# Patient Record
Sex: Female | Born: 2000
Health system: Southern US, Community
[De-identification: ages and names within clinical notes are randomized; demographics above are authoritative.]

## PROBLEM LIST (undated history)

## (undated) DIAGNOSIS — K900 Celiac disease: Secondary | ICD-10-CM

## (undated) DIAGNOSIS — M419 Scoliosis, unspecified: Secondary | ICD-10-CM

## (undated) HISTORY — DX: Scoliosis, unspecified: M41.9

## (undated) HISTORY — PX: TYMPANOSTOMY: SHX2586

## (undated) HISTORY — DX: Celiac disease: K90.0

## (undated) HISTORY — PX: ADENOIDECTOMY: SUR15

---
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
## 2022-01-02 ENCOUNTER — Telehealth: Payer: Self-pay | Admitting: Cardiology

## 2022-01-02 ENCOUNTER — Other Ambulatory Visit: Payer: Self-pay | Admitting: *Deleted

## 2022-01-02 DIAGNOSIS — R931 Abnormal findings on diagnostic imaging of heart and coronary circulation: Secondary | ICD-10-CM

## 2022-01-02 DIAGNOSIS — R002 Palpitations: Secondary | ICD-10-CM

## 2022-01-02 DIAGNOSIS — R0602 Shortness of breath: Secondary | ICD-10-CM

## 2022-01-02 NOTE — Telephone Encounter (Signed)
BMET order changed to [facility] Order faxed

## 2022-01-02 NOTE — Telephone Encounter (Signed)
New Message:t     Patient's: Mother wants to know if she can have lab work at [facility] and if so will you need to send her an order?

## 2022-01-05 DIAGNOSIS — M4125 Other idiopathic scoliosis, thoracolumbar region: Secondary | ICD-10-CM | POA: Diagnosis not present

## 2022-01-05 DIAGNOSIS — S134XXA Sprain of ligaments of cervical spine, initial encounter: Secondary | ICD-10-CM | POA: Diagnosis not present

## 2022-01-05 DIAGNOSIS — R0602 Shortness of breath: Secondary | ICD-10-CM | POA: Diagnosis not present

## 2022-01-05 DIAGNOSIS — R002 Palpitations: Secondary | ICD-10-CM | POA: Diagnosis not present

## 2022-01-05 DIAGNOSIS — S338XXA Sprain of other parts of lumbar spine and pelvis, initial encounter: Secondary | ICD-10-CM | POA: Diagnosis not present

## 2022-01-05 DIAGNOSIS — R931 Abnormal findings on diagnostic imaging of heart and coronary circulation: Secondary | ICD-10-CM | POA: Diagnosis not present

## 2022-01-05 LAB — BASIC METABOLIC PANEL
BUN: 11 mg/dL (ref 7–20)
CO2: 27 mmol/L (ref 20–32)
Calcium: 9.4 mg/dL (ref 8.9–10.4)
Chloride: 106 mmol/L (ref 98–110)
Creat: 0.77 mg/dL (ref 0.50–0.96)
Glucose, Bld: 102 mg/dL (ref 65–139)
Potassium: 4.2 mmol/L (ref 3.8–5.1)
Sodium: 140 mmol/L (ref 135–146)

## 2022-01-07 ENCOUNTER — Other Ambulatory Visit: Payer: Self-pay

## 2022-01-07 ENCOUNTER — Ambulatory Visit
Admission: RE | Admit: 2022-01-07 | Discharge: 2022-01-07 | Disposition: A | Discharge status: Home / Self Care | Payer: 59 | Source: Ambulatory Visit | Attending: Cardiology | Admitting: Cardiology

## 2022-01-07 ENCOUNTER — Telehealth: Payer: Self-pay | Admitting: Cardiology

## 2022-01-07 DIAGNOSIS — R002 Palpitations: Secondary | ICD-10-CM

## 2022-01-07 DIAGNOSIS — R0602 Shortness of breath: Secondary | ICD-10-CM

## 2022-01-07 DIAGNOSIS — R931 Abnormal findings on diagnostic imaging of heart and coronary circulation: Secondary | ICD-10-CM

## 2022-01-07 NOTE — Telephone Encounter (Signed)
[facility] was not able to get IV in here they tried six time and each time it will blow. She would need to have this down at the hospital for the under ultrasound guided.

## 2022-01-07 NOTE — Telephone Encounter (Signed)
Please read below message.

## 2022-01-08 ENCOUNTER — Other Ambulatory Visit: Payer: Self-pay | Admitting: *Deleted

## 2022-01-08 ENCOUNTER — Telehealth: Payer: Self-pay | Admitting: Cardiology

## 2022-01-08 DIAGNOSIS — R931 Abnormal findings on diagnostic imaging of heart and coronary circulation: Secondary | ICD-10-CM

## 2022-01-08 DIAGNOSIS — R002 Palpitations: Secondary | ICD-10-CM

## 2022-01-08 DIAGNOSIS — R0602 Shortness of breath: Secondary | ICD-10-CM

## 2022-01-08 NOTE — Telephone Encounter (Signed)
PERCERT:  CT ANGIO ABD/PEL W/CM AND/OR W  [facility]   01/21/2022

## 2022-01-12 ENCOUNTER — Telehealth: Payer: Self-pay | Admitting: Cardiology

## 2022-01-12 NOTE — Telephone Encounter (Signed)
PERCERT:  CT ANGIO ABD/PEL W AND OR W/O   01/15/2022  [facility]

## 2022-01-15 ENCOUNTER — Other Ambulatory Visit: Payer: 59

## 2022-01-20 ENCOUNTER — Other Ambulatory Visit (HOSPITAL_COMMUNITY): Payer: 59

## 2022-01-21 ENCOUNTER — Ambulatory Visit: Payer: 59

## 2022-01-26 DIAGNOSIS — Z23 Encounter for immunization: Secondary | ICD-10-CM | POA: Diagnosis not present

## 2022-01-27 ENCOUNTER — Ambulatory Visit
Admission: RE | Admit: 2022-01-27 | Discharge: 2022-01-27 | Disposition: A | Discharge status: Home / Self Care | Payer: 59 | Source: Ambulatory Visit | Attending: Cardiology | Admitting: Cardiology

## 2022-01-27 ENCOUNTER — Telehealth: Payer: Self-pay | Admitting: Cardiology

## 2022-01-27 DIAGNOSIS — R931 Abnormal findings on diagnostic imaging of heart and coronary circulation: Secondary | ICD-10-CM

## 2022-01-27 DIAGNOSIS — I701 Atherosclerosis of renal artery: Secondary | ICD-10-CM | POA: Diagnosis not present

## 2022-01-27 DIAGNOSIS — K551 Chronic vascular disorders of intestine: Secondary | ICD-10-CM | POA: Diagnosis not present

## 2022-01-27 DIAGNOSIS — R002 Palpitations: Secondary | ICD-10-CM

## 2022-01-27 DIAGNOSIS — R0602 Shortness of breath: Secondary | ICD-10-CM

## 2022-01-27 DIAGNOSIS — Q453 Other congenital malformations of pancreas and pancreatic duct: Secondary | ICD-10-CM | POA: Diagnosis not present

## 2022-01-27 DIAGNOSIS — I774 Celiac artery compression syndrome: Secondary | ICD-10-CM | POA: Diagnosis not present

## 2022-01-27 MED ORDER — IOPAMIDOL (ISOVUE-370) INJECTION 76%
75.0000 mL | Freq: Once | INTRAVENOUS | Status: AC | PRN
  Administered 2022-01-27: 75 mL via INTRAVENOUS

## 2022-01-27 NOTE — Telephone Encounter (Signed)
Spoke with Olivia Mackie who says Dr. Adria Devon wanted to call report to Dr. Domenic Polite. Advised Domenic Polite will be back in office tomorrow and it could be called to provider on call. Per Olivia Mackie, Dr. Adria Devon will call office tomorrow when Domenic Polite is in office.  ?

## 2022-01-27 NOTE — Telephone Encounter (Signed)
Call transferred to [facility], RN to discuss abnormal CT results. ?

## 2022-01-28 ENCOUNTER — Telehealth: Payer: Self-pay | Admitting: *Deleted

## 2022-01-28 DIAGNOSIS — I771 Stricture of artery: Secondary | ICD-10-CM

## 2022-01-28 NOTE — Telephone Encounter (Signed)
CTA abdomen and pelvis does show significant proximal celiac artery stenosis with poststenotic dilatation, possibly secondary to compression by ligament and with associated collateral flow, although distal celiac artery and branches are widely patent ?Per Dr. Kara Pacer ahead with Vascular Consult ?Patient's mom, Kendal Hymen informed and verbalized understanding of plan. ?Request Dr. Myra Gianotti ?

## 2022-02-09 ENCOUNTER — Encounter: Payer: Self-pay | Admitting: Cardiology

## 2022-02-12 ENCOUNTER — Encounter (INDEPENDENT_AMBULATORY_CARE_PROVIDER_SITE_OTHER): Payer: Self-pay | Admitting: Gastroenterology

## 2022-02-12 ENCOUNTER — Ambulatory Visit (INDEPENDENT_AMBULATORY_CARE_PROVIDER_SITE_OTHER): Payer: 59 | Admitting: Gastroenterology

## 2022-02-12 DIAGNOSIS — I771 Stricture of artery: Secondary | ICD-10-CM

## 2022-02-12 DIAGNOSIS — R768 Other specified abnormal immunological findings in serum: Secondary | ICD-10-CM | POA: Diagnosis not present

## 2022-02-12 NOTE — Patient Instructions (Addendum)
Follow up with Dr. Myra Gianotti - vascular surgery ?Will request official lab report from PCP ?Perform blood workup ?

## 2022-02-12 NOTE — Progress Notes (Signed)
[name], M.D. ?Gastroenterology & Hepatology ?[facility] ?[address] ?[place], [place] [address] ?Primary Care Physician: ?Joaquin Courts, DO ?[address] ?[place] [address] ? ?Referring MD: Sherren Mocha, PA-C ? ?Chief Complaint: Positive celiac serology and celiac artery stenosis ? ?History of Present Illness: ?Evelyn Haley is a 21 y.o. female with no past medical history, who presents for evaluation of positive celiac serology and celiac artery stenosis. ? ?She denies having any complaint today. States she had blood testing performed at her PCPs office as she had positive celiac serologies.  She was checked for celiac disease as her mother has presumptive diagnosis of celiac disease.  The patient denies having any nausea, vomiting, fever, chills, hematochezia, melena, hematemesis, abdominal distention, abdominal pain, diarrhea, jaundice, pruritus, headaches, mental fogginess or weight loss. ? ?Notably, as part of the evaluation of shortness of breath, the patient was seen in the cardiology clinic.  She was set up for an echocardiogram which was performed on 12/25/2021.  There was turbulent arterial flow of the artery branching of the abdominal aorta suggesting a proximal stenosis.  Due to this, the patient was referred for a CT angio of the abdomen and pelvis.  Underwent this test on 01/27/2022 showing severe stenosis near the origin of the celiac artery suggestive of median arcuate ligament compression with poststenotic dilation.She has an appointment with vascular surgery on Monday (referred by cardiology). ? ?No lab results were available for today's visit.  Joanette Gula, CMA called to the PCPs office and was told the gliadin was elevated at 25. ? ?She currently does not follow any diet. ? ?Last LGX:QJJHERDE ?Last Colonoscopy:negatie ? ?FHx: mother has possible celiac disease, grandfather had celiac disease, neg for any  gastrointestinal/liver disease, father tongue cancer, grandmother breast, uterine cancer and PML, breast cancer aunts x3,uncle pancreatic  cancer, grandfather kidney and lung cancer ?Social: neg smoking, alcohol or illicit drug use ?Surgical: no abdominal surgeries ? ?Past Medical History: ?Past Medical History:  ?Diagnosis Date  ? Celiac disease   ? Scoliosis   ? ? ?Past Surgical History: ?Past Surgical History:  ?Procedure Laterality Date  ? ADENOIDECTOMY    ? TYMPANOSTOMY    ? ? ?Family History: ?Family History  ?Problem Relation Age of Onset  ? Heart disease Mother   ?     Atrial myxoma  ? Heart disease Father   ? Diabetes Father   ? Hypertension Father   ? Colon cancer Father   ? Diabetes Maternal Grandmother   ? Hypertension Maternal Grandmother   ? Lung cancer Maternal Grandmother   ? Heart disease Maternal Grandmother   ? Hypertension Maternal Grandfather   ? Atrial fibrillation Maternal Grandfather   ? Factor V Leiden deficiency Maternal Grandfather   ? Thyroid disease Paternal Grandmother   ? Breast cancer Paternal Grandmother   ? Leukemia Paternal Grandmother   ? Hyperlipidemia Paternal Grandmother   ? Hyperlipidemia Paternal Grandfather   ? Kidney cancer Paternal Grandfather   ? Lung cancer Paternal Grandfather   ? Pulmonary embolism Other   ? ? ?Social History: ?Social History  ? ?Tobacco Use  ?Smoking Status Never  ?Smokeless Tobacco Never  ? ?Social History  ? ?Substance and Sexual Activity  ?Alcohol Use No  ? ?Social History  ? ?Substance and Sexual Activity  ?Drug Use No  ? ? ?Allergies: ?No Known Allergies ? ?Medications: ?Current Outpatient Medications  ?Medication Sig Dispense Refill  ? fexofenadine (ALLEGRA) 180 MG tablet Take  180 mg by mouth as needed.    ? ibuprofen (ADVIL,MOTRIN) 200 MG tablet Take 600 mg by mouth daily as needed for moderate pain.    ? ?No current facility-administered medications for this visit.  ? ? ?Review of Systems: ?GENERAL: negative for malaise, night sweats ?HEENT:  No changes in hearing or vision, no nose bleeds or other nasal problems. ?NECK: Negative for lumps, goiter, pain and significant neck swelling ?RESPIRATORY: Negative for cough, wheezing ?CARDIOVASCULAR: Negative for chest pain, leg swelling, palpitations, orthopnea ?GI: SEE HPI ?MUSCULOSKELETAL: Negative for joint pain or swelling, back pain, and muscle pain. ?SKIN: Negative for lesions, rash ?PSYCH: Negative for sleep disturbance, mood disorder and recent psychosocial stressors. ?HEMATOLOGY Negative for prolonged bleeding, bruising easily, and swollen nodes. ?ENDOCRINE: Negative for cold or heat intolerance, polyuria, polydipsia and goiter. ?NEURO: negative for tremor, gait imbalance, syncope and seizures. ?The remainder of the review of systems is noncontributory. ? ? ?Physical Exam: ?BP 106/72 (BP Location: Right Arm, Patient Position: Sitting, Cuff Size: Small)   Pulse 75   Temp 97.8 ?F (36.6 ?C) (Oral)   Ht 5\' 5"  (1.651 m)   Wt 127 lb 6.4 oz (57.8 kg)   LMP 02/03/2022 (Approximate)   BMI 21.20 kg/m?  ?GENERAL: The patient is AO x3, in no acute distress. ?HEENT: Head is normocephalic and atraumatic. EOMI are intact. Mouth is well hydrated and without lesions. ?NECK: Supple. No masses ?LUNGS: Clear to auscultation. No presence of rhonchi/wheezing/rales. Adequate chest expansion ?HEART: RRR, normal s1 and s2. ?ABDOMEN: Soft, nontender, no guarding, no peritoneal signs, and nondistended. BS +. No masses. ?EXTREMITIES: Without any cyanosis, clubbing, rash, lesions or edema. ?NEUROLOGIC: AOx3, no focal motor deficit. ?SKIN: no jaundice, no rashes ? ? ?Imaging/Labs: ?as above ? ?I personally reviewed and interpreted the available labs, imaging and endoscopic files. ? ?Impression and Plan: ?Evelyn Haley is a 21 y.o. female with no past medical history, who presents for evaluation of positive celiac serology and celiac artery stenosis.  Patient has been asymptomatic from the gastrointestinal perspective and  denies having any abdominal complaints.  It is unclear if she had positive serology for celiac disease but given her family history we will recheck it today as we were not able to obtain the records from her PCPs office.  I explained to the mother and the patient that if the serologies come back elevated, we will need to proceed with an EGD to confirm this diagnosis.  Both agreed understood. ? ?In terms of her celiac artery stenosis, she has evidence on imaging of this abnormality but has not presented any abdominal pain.  I explained that this is important as the patient does not have abdominal pain with this anatomic abnormality, clinical monitoring may be adequate.  In case she presents any new onset of abdominal pain, nausea or vomiting, further surgical management of this alteration can be considered.  She will follow with vascular surgery next week as well. ? ?- Follow up with Dr. 15 - vascular surgery ?- Will request official lab report from PCP ?- Check celiac serologies ? ?All questions were answered.     ? ?Myra Gianotti, MD ?Gastroenterology and Hepatology ?[facility] ? ? ?ADDENDUM: ?While patient was noted, I received the results of her most recent blood work-up on 10/15/2021 that showed clear lung IgG of 25, negative antiendomysial antibody and negative gliadin  IgA.  We will still need TTG IgA levels ?

## 2022-02-14 LAB — CELIAC DISEASE PANEL
(tTG) Ab, IgA: 9.3 U/mL
(tTG) Ab, IgG: <1 U/mL
Gliadin IgA: 2.1 U/mL
Gliadin IgG: 12.3 U/mL
Immunoglobulin A: 75 mg/dL (ref 47–310)

## 2022-02-16 ENCOUNTER — Ambulatory Visit: Payer: 59 | Admitting: Surgery

## 2022-02-16 ENCOUNTER — Encounter: Payer: Self-pay | Admitting: Surgery

## 2022-02-16 VITALS — BP 103/70 | HR 67 | Temp 98.1°F | Resp 20 | Ht 65.0 in | Wt 126.0 lb

## 2022-02-16 DIAGNOSIS — I774 Celiac artery compression syndrome: Secondary | ICD-10-CM | POA: Diagnosis not present

## 2022-02-16 DIAGNOSIS — S134XXA Sprain of ligaments of cervical spine, initial encounter: Secondary | ICD-10-CM | POA: Diagnosis not present

## 2022-02-16 DIAGNOSIS — S338XXA Sprain of other parts of lumbar spine and pelvis, initial encounter: Secondary | ICD-10-CM | POA: Diagnosis not present

## 2022-02-16 DIAGNOSIS — M4125 Other idiopathic scoliosis, thoracolumbar region: Secondary | ICD-10-CM | POA: Diagnosis not present

## 2022-02-16 NOTE — Progress Notes (Signed)
? ?[facility] ? ?Patient name: Evelyn Haley MRN: [id number] DOB: 11-10-2001 Sex: female ? ? ?REQUESTING PROVIDER:  ? ? Dr. Diona Browner ? ? ?REASON FOR CONSULT:  ?  ?Celiac stenosis ? ?HISTORY OF PRESENT ILLNESS:  ? ?Evelyn Haley is a 21 y.o. female, who is referred for evaluation of celiac artery stenosis.  In November of last year, she experienced some chest pain and shortness of breath.  She was referred to cardiology.  A echocardiogram was performed.  This revealed turbulent flow in the abdomen, and so she was sent for a CT scan which showed severe celiac artery stenosis with poststenotic dilatation, and findings concerning for median arcuate ligament syndrome.  The patient denies any abdominal pain.  She does not have postprandial abdominal pain.  She denies any weight loss. ? ?There is a possible family history of celiac disease.  The patient is being worked up for this.  She has positive celiac serologies.  She is going to get a EGD to confirm her diagnosis. ? ?PAST MEDICAL HISTORY  ? ? ?Past Medical History:  ?Diagnosis Date  ? Celiac disease   ? Scoliosis   ? ? ? ?FAMILY HISTORY  ? ?Family History  ?Problem Relation Age of Onset  ? Heart disease Mother   ?     Atrial myxoma  ? Heart disease Father   ? Diabetes Father   ? Hypertension Father   ? Colon cancer Father   ? Diabetes Maternal Grandmother   ? Hypertension Maternal Grandmother   ? Lung cancer Maternal Grandmother   ? Heart disease Maternal Grandmother   ? Hypertension Maternal Grandfather   ? Atrial fibrillation Maternal Grandfather   ? Factor V Leiden deficiency Maternal Grandfather   ? Thyroid disease Paternal Grandmother   ? Breast cancer Paternal Grandmother   ? Leukemia Paternal Grandmother   ? Hyperlipidemia Paternal Grandmother   ? Hyperlipidemia Paternal Grandfather   ? Kidney cancer Paternal Grandfather   ? Lung cancer Paternal Grandfather   ? Pulmonary embolism Other    ? ? ?SOCIAL HISTORY:  ? ?Social History  ? ?Socioeconomic History  ? Marital status: Single  ?  Spouse name: Not on file  ? Number of children: Not on file  ? Years of education: Not on file  ? Highest education level: Not on file  ?Occupational History  ? Not on file  ?Tobacco Use  ? Smoking status: Never  ? Smokeless tobacco: Never  ?Vaping Use  ? Vaping Use: Never used  ?Substance and Sexual Activity  ? Alcohol use: No  ? Drug use: No  ? Sexual activity: Not on file  ?Other Topics Concern  ? Not on file  ?Social History Narrative  ? Not on file  ? ?Social Determinants of Health  ? ?Financial Resource Strain: Not on file  ?Food Insecurity: Not on file  ?Transportation Needs: Not on file  ?Physical Activity: Not on file  ?Stress: Not on file  ?Social Connections: Not on file  ?Intimate Partner Violence: Not on file  ? ? ?ALLERGIES:  ? ? ?No Known Allergies ? ?CURRENT MEDICATIONS:  ? ? ?Current Outpatient Medications  ?Medication Sig Dispense Refill  ? fexofenadine (ALLEGRA) 180 MG tablet Take 180 mg by mouth as needed.    ? ibuprofen (ADVIL,MOTRIN) 200 MG tablet Take 600 mg by mouth daily as needed for moderate pain.    ? ?No current facility-administered medications for this visit.  ? ? ?REVIEW OF SYSTEMS:  ? ?[  X] denotes positive finding, [ ]  denotes negative finding ?Cardiac  Comments:  ?Chest pain or chest pressure:    ?Shortness of breath upon exertion:    ?Short of breath when lying flat:    ?Irregular heart rhythm:    ?    ?Vascular    ?Pain in calf, thigh, or hip brought on by ambulation:    ?Pain in feet at night that wakes you up from your sleep:     ?Blood clot in your veins:    ?Leg swelling:     ?    ?Pulmonary    ?Oxygen at home:    ?Productive cough:     ?Wheezing:     ?    ?Neurologic    ?Sudden weakness in arms or legs:     ?Sudden numbness in arms or legs:     ?Sudden onset of difficulty speaking or slurred speech:    ?Temporary loss of vision in one eye:     ?Problems with dizziness:     ?     ?Gastrointestinal    ?Blood in stool:     ? ?Vomited blood:     ?    ?Genitourinary    ?Burning when urinating:     ?Blood in urine:    ?    ?Psychiatric    ?Major depression:     ?    ?Hematologic    ?Bleeding problems:    ?Problems with blood clotting too easily:    ?    ?Skin    ?Rashes or ulcers:    ?    ?Constitutional    ?Fever or chills:    ? ?PHYSICAL EXAM:  ? ?There were no vitals filed for this visit. ? ?GENERAL: The patient is a well-nourished female, in no acute distress. The vital signs are documented above. ?CARDIAC: There is a regular rate and rhythm.  ?VASCULAR: Palpable pedal pulses ?PULMONARY: Nonlabored respirations ?ABDOMEN: Soft and non-tender.  No discomfort with deep palpation ?MUSCULOSKELETAL: There are no major deformities or cyanosis. ?NEUROLOGIC: No focal weakness or paresthesias are detected. ?SKIN: There are no ulcers or rashes noted. ?PSYCHIATRIC: The patient has a normal affect. ? ?STUDIES:  ? ?I have reviewed the following CTA: ?The celiac artery demonstrates severe stenosis near its origin, an ?appearance compatible with median arcuate ligament compression. ?There is poststenotic dilation, which likely explains the findings ?of turbulent flow and elevated velocities on recent arterial duplex ?exam. The distal celiac artery and its branches are widely patent. ?In the absence of abdominal symptoms, this finding is of uncertain ?clinical significance as it can be seen in asymptomatic patients. ?  ? ?ASSESSMENT and PLAN  ? ?Celiac artery stenosis/median arcuate ligament syndrome: I discussed with the patient and her mother, that because she is completely asymptomatic, I would not recommend surgical decompression at this time.  I instructed her to monitor for abdominal pain, postprandial abdominal pain, and weight loss.  These factors would potentially lead me to consider celiac artery decompression.  As long as she remains asymptomatic, I would not recommend treatment.  She will contact  me if her condition changes.  Otherwise, I will see her back as needed ? ? ? , IV, MD, FACS ?[facility] ?Tel [phone] ?Pager [phone]  ?

## 2022-03-04 DIAGNOSIS — S338XXA Sprain of other parts of lumbar spine and pelvis, initial encounter: Secondary | ICD-10-CM | POA: Diagnosis not present

## 2022-03-04 DIAGNOSIS — M4125 Other idiopathic scoliosis, thoracolumbar region: Secondary | ICD-10-CM | POA: Diagnosis not present

## 2022-03-04 DIAGNOSIS — S134XXA Sprain of ligaments of cervical spine, initial encounter: Secondary | ICD-10-CM | POA: Diagnosis not present

## 2022-04-03 DIAGNOSIS — H6123 Impacted cerumen, bilateral: Secondary | ICD-10-CM | POA: Diagnosis not present

## 2022-04-03 DIAGNOSIS — H608X3 Other otitis externa, bilateral: Secondary | ICD-10-CM | POA: Diagnosis not present

## 2022-05-05 DIAGNOSIS — S134XXA Sprain of ligaments of cervical spine, initial encounter: Secondary | ICD-10-CM | POA: Diagnosis not present

## 2022-05-05 DIAGNOSIS — M4125 Other idiopathic scoliosis, thoracolumbar region: Secondary | ICD-10-CM | POA: Diagnosis not present

## 2022-05-05 DIAGNOSIS — S338XXA Sprain of other parts of lumbar spine and pelvis, initial encounter: Secondary | ICD-10-CM | POA: Diagnosis not present

## 2022-05-26 DIAGNOSIS — M4125 Other idiopathic scoliosis, thoracolumbar region: Secondary | ICD-10-CM | POA: Diagnosis not present

## 2022-05-26 DIAGNOSIS — S338XXA Sprain of other parts of lumbar spine and pelvis, initial encounter: Secondary | ICD-10-CM | POA: Diagnosis not present

## 2022-05-26 DIAGNOSIS — S134XXA Sprain of ligaments of cervical spine, initial encounter: Secondary | ICD-10-CM | POA: Diagnosis not present

## 2022-05-27 ENCOUNTER — Encounter: Payer: Self-pay | Admitting: Physician Assistant

## 2022-05-27 ENCOUNTER — Ambulatory Visit (INDEPENDENT_AMBULATORY_CARE_PROVIDER_SITE_OTHER): Payer: 59 | Admitting: Physician Assistant

## 2022-05-27 DIAGNOSIS — Z1283 Encounter for screening for malignant neoplasm of skin: Secondary | ICD-10-CM | POA: Diagnosis not present

## 2022-05-27 DIAGNOSIS — Z840 Family history of diseases of the skin and subcutaneous tissue: Secondary | ICD-10-CM

## 2022-05-27 DIAGNOSIS — L409 Psoriasis, unspecified: Secondary | ICD-10-CM | POA: Diagnosis not present

## 2022-05-27 MED ORDER — TRIAMCINOLONE ACETONIDE 0.1 % EX CREA: TOPICAL_CREAM | CUTANEOUS | 2 refills | Status: AC

## 2022-05-27 MED ORDER — CLOBETASOL PROP EMOLLIENT BASE 0.05 % EX CREA: TOPICAL_CREAM | CUTANEOUS | 8 refills | Status: AC

## 2022-05-27 NOTE — Progress Notes (Signed)
   New Patient   Subjective  Evelyn Haley is a 21 y.o. female who presents for the following: New Patient (Initial Visit) (Patient here today for skin check, per patient she was just diagnosed x 1 year ago with eczema and she believes it's now under both axilla and posterior neck. Per patient she was given TAC for the eczema in her ears and she doesn't have much to use for her axilla or neck. Family history of atypical mole. ).   The following portions of the chart were reviewed this encounter and updated as appropriate:  Tobacco  Allergies  Meds  Problems  Med Hx  Surg Hx  Fam Hx      Objective  Well appearing patient in no apparent distress; mood and affect are within normal limits.  A full examination was performed including scalp, head, eyes, ears, nose, lips, neck, chest, axillae, abdomen, back, buttocks, bilateral upper extremities, bilateral lower extremities, hands, feet, fingers, toes, fingernails, and toenails. All findings within normal limits unless otherwise noted below.  No atypical nevi or signs of NMSC noted at the time of the visit.   Neck - Posterior Well-marginated erythematous plaques.         Assessment & Plan  Encounter for screening for malignant neoplasm of skin  Yearly skin examination  Psoriasis Neck - Posterior  triamcinolone cream (KENALOG) 0.1 % - Neck - Posterior Apply to affected area wd- not for face, folds or groin  Clobetasol Prop Emollient Base (CLOBETASOL PROPIONATE E) 0.05 % emollient cream - Neck - Posterior Aaa qd- not for face, folds or groin     I, Caleah Tortorelli, PA-C, have reviewed all documentation's for this visit.  The documentation on 05/27/22 for the exam, diagnosis, procedures and orders are all accurate and complete.

## 2022-06-02 ENCOUNTER — Encounter: Payer: Self-pay | Admitting: Physician Assistant

## 2022-06-23 DIAGNOSIS — S134XXA Sprain of ligaments of cervical spine, initial encounter: Secondary | ICD-10-CM | POA: Diagnosis not present

## 2022-06-23 DIAGNOSIS — M4125 Other idiopathic scoliosis, thoracolumbar region: Secondary | ICD-10-CM | POA: Diagnosis not present

## 2022-06-23 DIAGNOSIS — S338XXA Sprain of other parts of lumbar spine and pelvis, initial encounter: Secondary | ICD-10-CM | POA: Diagnosis not present

## 2022-07-16 IMAGING — CT CT CTA ABD/PEL W/CM AND/OR W/O CM
1 of 2 series · 11 of 32 positions shown, 17 images · IV contrast (agent unspecified)
Comparison: CT chest October 2021

CLINICAL DATA: abdominal CTA with attention to aorta and mesenteric
vasculature, abnormal echo with elevated turbulent flow/elevated
velocity of an aortic branch vessel

EXAM:
CTA ABDOMEN AND PELVIS WITHOUT AND WITH CONTRAST
TECHNIQUE: Multidetector CT imaging of the abdomen and pelvis was performed
using the standard protocol during bolus administration of
intravenous contrast. Multiplanar reconstructed images and MIPs were
obtained and reviewed to evaluate the vascular anatomy.

[Series 10: venous 5.0 · axial · portal-venous · 0.71mm/px · z∈[-497,-72]mm · 11 of 103 slices shown, 17 images]
[im 9/103  soft-tissue]
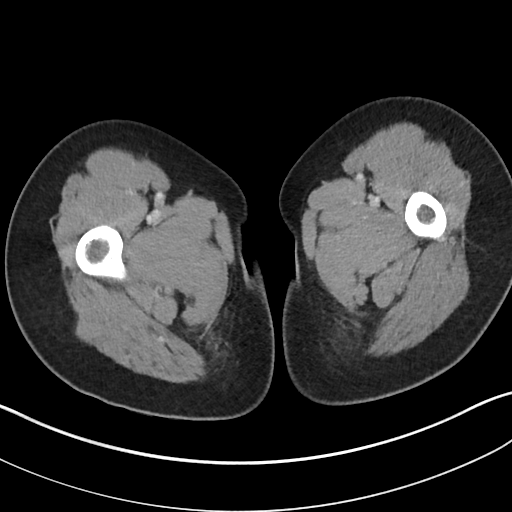
[im 9/103  bone]
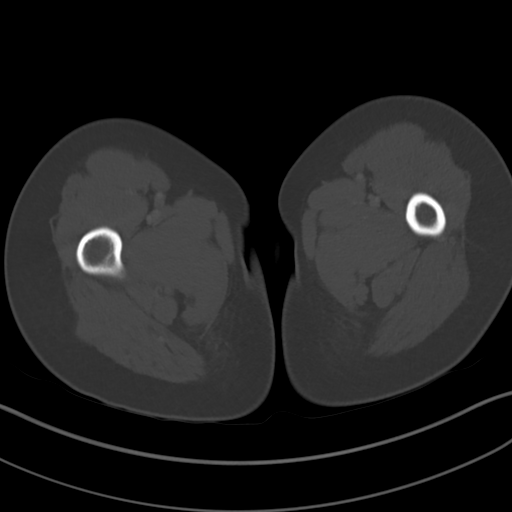
[im 18/103  soft-tissue]
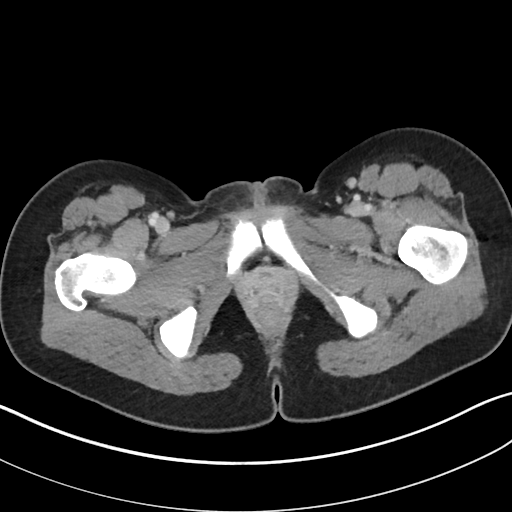
[im 26/103  soft-tissue]
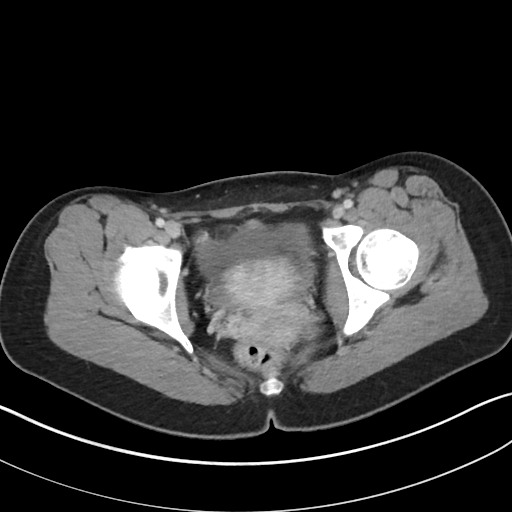
[im 35/103  soft-tissue]
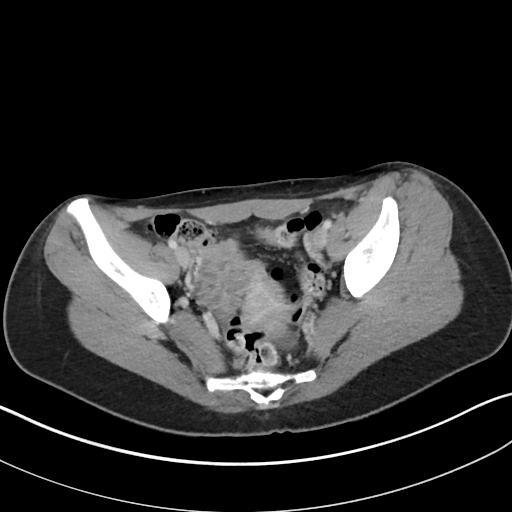
[im 43/103  soft-tissue]
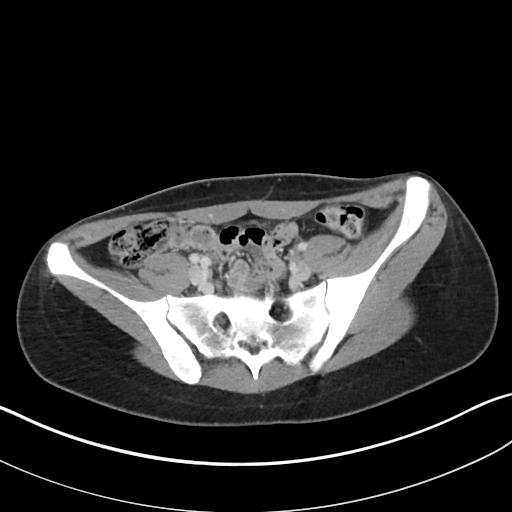
[im 52/103  soft-tissue]
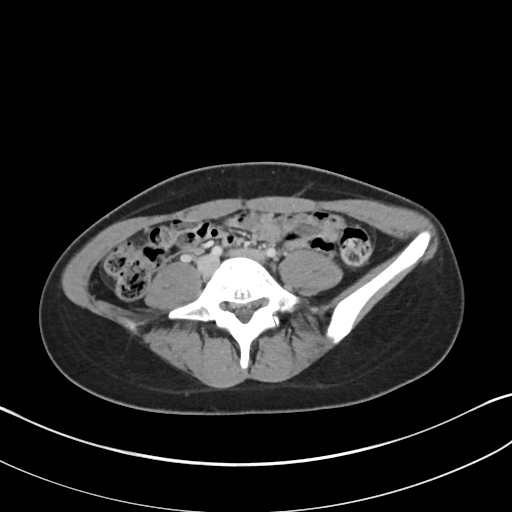
[im 60/103  soft-tissue]
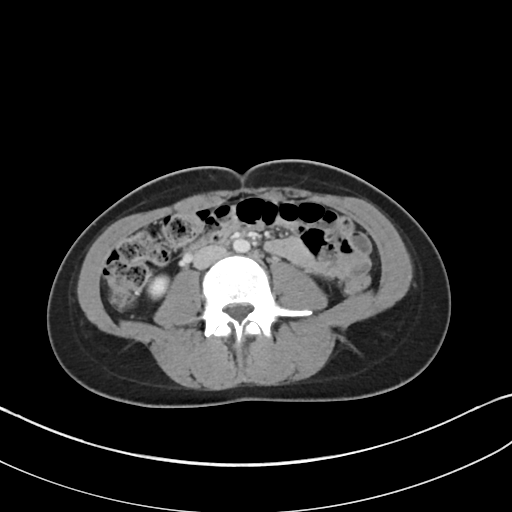
[im 69/103  soft-tissue]
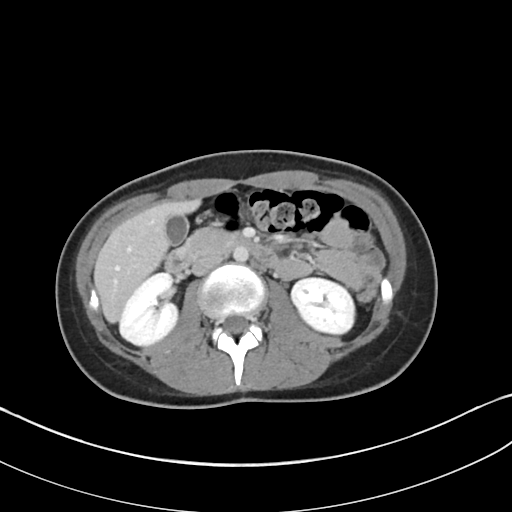
[im 69/103  lung]
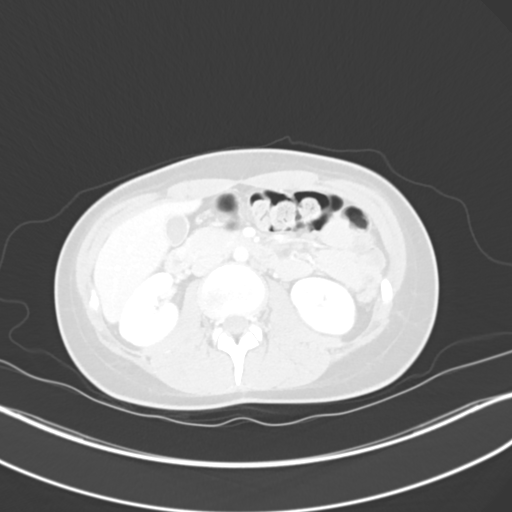
[im 77/103  soft-tissue]
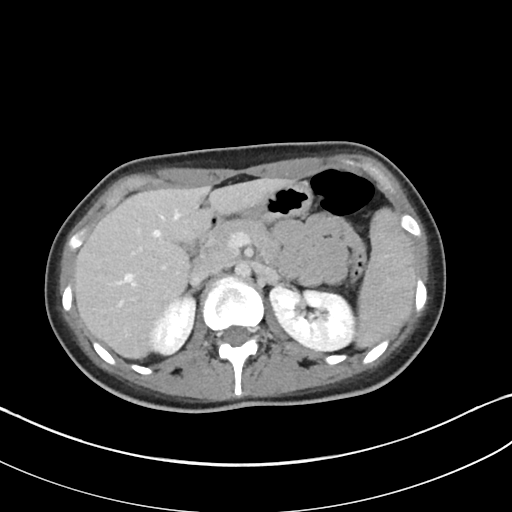
[im 77/103  lung]
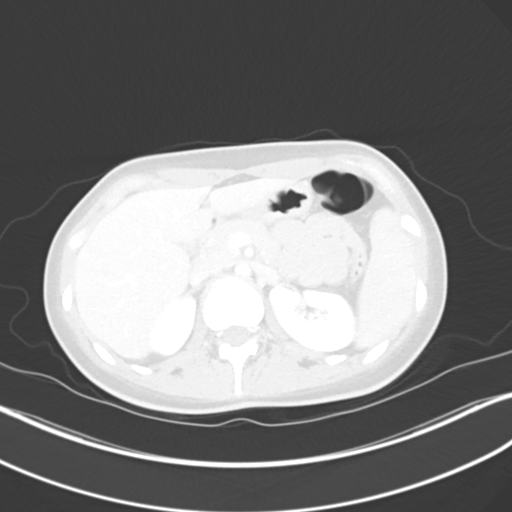
[im 77/103  bone]
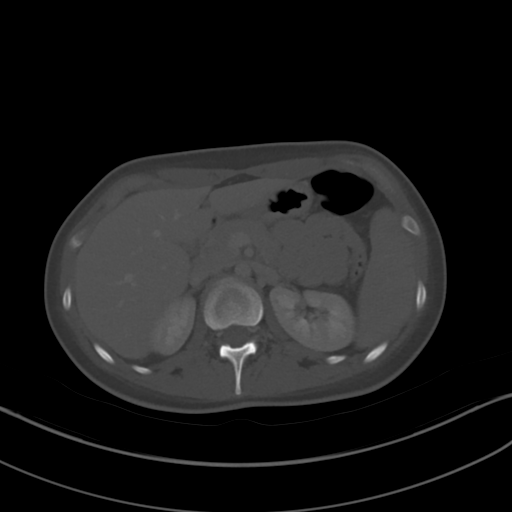
[im 86/103  soft-tissue]
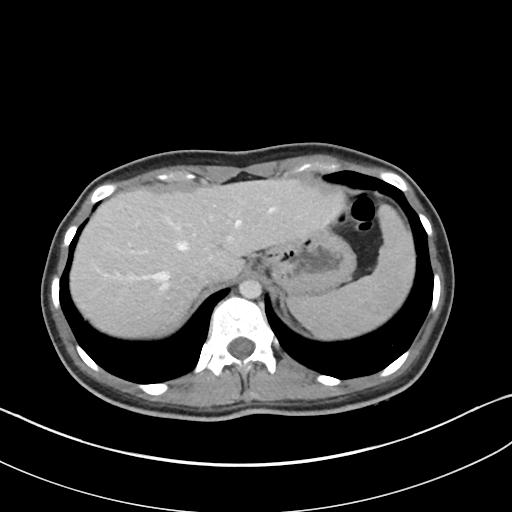
[im 86/103  lung]
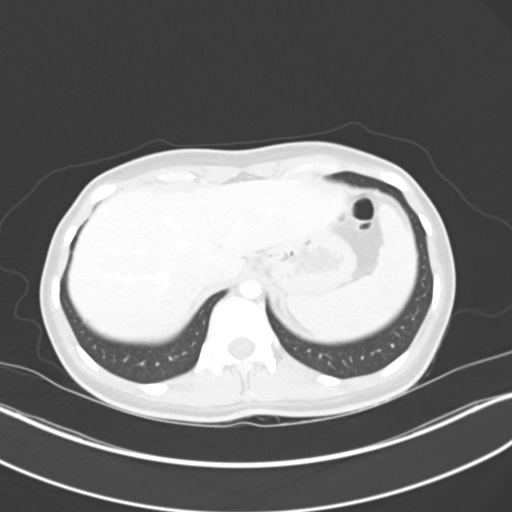
[im 94/103  soft-tissue]
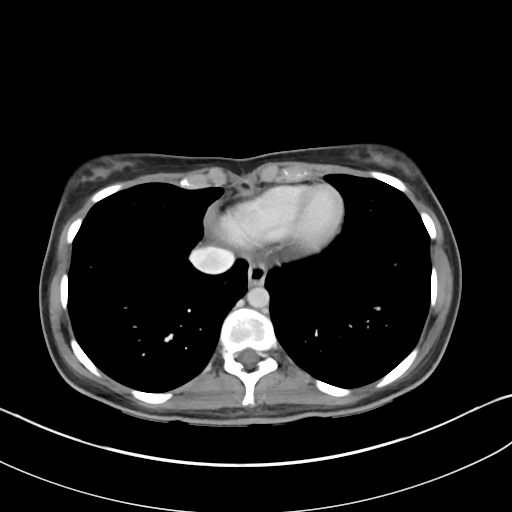
[im 94/103  lung]
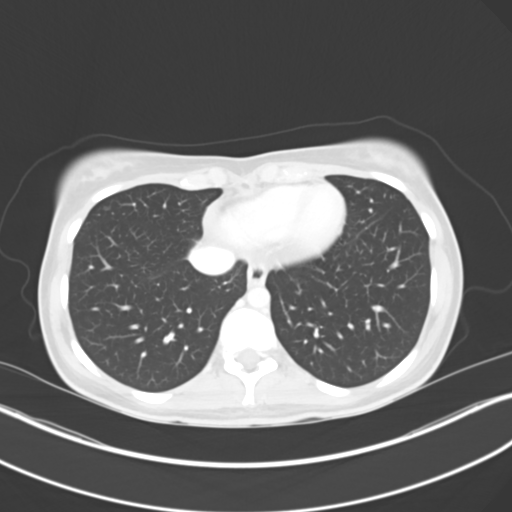

[11 of 32 positions shown; findings below may reference images not displayed]

RADIATION DOSE REDUCTION: This exam was performed according to the
departmental dose-optimization program which includes automated
exposure control, adjustment of the mA and/or kV according to
patient size and/or use of iterative reconstruction technique.

CONTRAST:  75mL [id number]-91T IOPAMIDOL ([id number]-91T) INJECTION 76%
FINDINGS: VASCULAR

Aorta: Normal caliber aorta without aneurysm, dissection, vasculitis
or significant stenosis.

Celiac: There is severe stenosis of the proximal celiac artery with
poststenotic dilation. The mid to distal celiac artery and its
branches are otherwise widely patent.

SMA: Patent without evidence of aneurysm, dissection, vasculitis or
significant stenosis. Note is made of a well-developed
pancreaticoduodenal arcade, likely due to collateral blood supply
from the SMA to the celiac distribution.

IMA: Patent.

Renals: Renal are patent without evidence of aneurysm, dissection,
vasculitis, fibromuscular dysplasia or significant stenosis. Single
renal arteries bilaterally.

Inflow: Patent without evidence of aneurysm, dissection, vasculitis
or significant stenosis.

Veins: The portal vein is patent. The deep veins and IVC are patent
without thrombus.

NON-VASCULAR

Inferior chest: The lung bases are well-aerated.

Hepatobiliary: The liver is normal in size without focal
abnormality. No intrahepatic or extrahepatic biliary ductal
dilation. The gallbladder appears normal.

Spleen: Normal in size without focal abnormality.

Pancreas: No pancreatic ductal dilatation or surrounding
inflammatory changes.

Adrenals/Urinary Tract: Adrenal glands are unremarkable. Kidneys are
normal, without renal calculi, focal lesion, or hydronephrosis.
Bladder is unremarkable.

Stomach/Bowel: The stomach, small bowel and large bowel are normal
in caliber without abnormal wall thickening or surrounding
inflammatory changes.

Reproductive: Normal physiologic appearance of the uterus and
ovaries.

Lymphatic: No enlarged lymph nodes in the abdomen or pelvis.

Other: No abdominopelvic ascites.

Musculoskeletal: No aggressive osseous lesions. The soft tissues are
unremarkable.
IMPRESSION: The celiac artery demonstrates severe stenosis near its origin, an
appearance compatible with median arcuate ligament compression.
There is poststenotic dilation, which likely explains the findings
of turbulent flow and elevated velocities on recent arterial duplex
exam. The distal celiac artery and its branches are widely patent.
In the absence of abdominal symptoms, this finding is of uncertain
clinical significance as it can be seen in asymptomatic patients.

## 2022-07-28 DIAGNOSIS — S338XXA Sprain of other parts of lumbar spine and pelvis, initial encounter: Secondary | ICD-10-CM | POA: Diagnosis not present

## 2022-07-28 DIAGNOSIS — S134XXA Sprain of ligaments of cervical spine, initial encounter: Secondary | ICD-10-CM | POA: Diagnosis not present

## 2022-07-28 DIAGNOSIS — M4125 Other idiopathic scoliosis, thoracolumbar region: Secondary | ICD-10-CM | POA: Diagnosis not present

## 2022-08-05 DIAGNOSIS — H6123 Impacted cerumen, bilateral: Secondary | ICD-10-CM | POA: Diagnosis not present

## 2022-08-25 DIAGNOSIS — M4125 Other idiopathic scoliosis, thoracolumbar region: Secondary | ICD-10-CM | POA: Diagnosis not present

## 2022-08-25 DIAGNOSIS — S338XXA Sprain of other parts of lumbar spine and pelvis, initial encounter: Secondary | ICD-10-CM | POA: Diagnosis not present

## 2022-08-25 DIAGNOSIS — S134XXA Sprain of ligaments of cervical spine, initial encounter: Secondary | ICD-10-CM | POA: Diagnosis not present

## 2022-11-10 DIAGNOSIS — H608X3 Other otitis externa, bilateral: Secondary | ICD-10-CM | POA: Diagnosis not present

## 2022-11-10 DIAGNOSIS — H6123 Impacted cerumen, bilateral: Secondary | ICD-10-CM | POA: Diagnosis not present

## 2023-01-04 DIAGNOSIS — L600 Ingrowing nail: Secondary | ICD-10-CM | POA: Diagnosis not present

## 2023-01-04 DIAGNOSIS — M79674 Pain in right toe(s): Secondary | ICD-10-CM | POA: Diagnosis not present

## 2023-01-05 DIAGNOSIS — H04123 Dry eye syndrome of bilateral lacrimal glands: Secondary | ICD-10-CM | POA: Diagnosis not present

## 2023-01-15 DIAGNOSIS — Z6821 Body mass index (BMI) 21.0-21.9, adult: Secondary | ICD-10-CM | POA: Diagnosis not present

## 2023-01-15 DIAGNOSIS — Z01419 Encounter for gynecological examination (general) (routine) without abnormal findings: Secondary | ICD-10-CM | POA: Diagnosis not present

## 2023-05-12 DIAGNOSIS — H5213 Myopia, bilateral: Secondary | ICD-10-CM | POA: Diagnosis not present

## 2023-06-03 DIAGNOSIS — R300 Dysuria: Secondary | ICD-10-CM | POA: Diagnosis not present

## 2023-06-14 DIAGNOSIS — H6123 Impacted cerumen, bilateral: Secondary | ICD-10-CM | POA: Diagnosis not present

## 2023-06-14 DIAGNOSIS — H60543 Acute eczematoid otitis externa, bilateral: Secondary | ICD-10-CM | POA: Diagnosis not present

## 2023-06-22 DIAGNOSIS — M79675 Pain in left toe(s): Secondary | ICD-10-CM | POA: Diagnosis not present

## 2023-06-22 DIAGNOSIS — L600 Ingrowing nail: Secondary | ICD-10-CM | POA: Diagnosis not present

## 2023-06-25 DIAGNOSIS — S134XXA Sprain of ligaments of cervical spine, initial encounter: Secondary | ICD-10-CM | POA: Diagnosis not present

## 2023-06-25 DIAGNOSIS — M4125 Other idiopathic scoliosis, thoracolumbar region: Secondary | ICD-10-CM | POA: Diagnosis not present

## 2023-06-25 DIAGNOSIS — S338XXA Sprain of other parts of lumbar spine and pelvis, initial encounter: Secondary | ICD-10-CM | POA: Diagnosis not present

## 2023-06-30 ENCOUNTER — Other Ambulatory Visit (HOSPITAL_COMMUNITY): Payer: Self-pay

## 2023-06-30 DIAGNOSIS — Z0000 Encounter for general adult medical examination without abnormal findings: Secondary | ICD-10-CM | POA: Diagnosis not present

## 2023-06-30 DIAGNOSIS — Z23 Encounter for immunization: Secondary | ICD-10-CM | POA: Diagnosis not present

## 2023-07-01 ENCOUNTER — Other Ambulatory Visit (HOSPITAL_COMMUNITY): Payer: Self-pay

## 2023-07-03 ENCOUNTER — Other Ambulatory Visit (HOSPITAL_COMMUNITY): Payer: Self-pay

## 2023-07-03 MED ORDER — EPINEPHRINE 0.3 MG/0.3ML IJ SOAJ
0.3000 mg | INTRAMUSCULAR | 0 refills | Status: AC
  Filled 2023-07-03: qty 2, 30d supply, fill #0

## 2023-07-05 ENCOUNTER — Other Ambulatory Visit (HOSPITAL_COMMUNITY): Payer: Self-pay

## 2023-07-06 ENCOUNTER — Other Ambulatory Visit (HOSPITAL_COMMUNITY): Payer: Self-pay

## 2023-07-23 DIAGNOSIS — S338XXA Sprain of other parts of lumbar spine and pelvis, initial encounter: Secondary | ICD-10-CM | POA: Diagnosis not present

## 2023-07-23 DIAGNOSIS — M4125 Other idiopathic scoliosis, thoracolumbar region: Secondary | ICD-10-CM | POA: Diagnosis not present

## 2023-07-23 DIAGNOSIS — S134XXA Sprain of ligaments of cervical spine, initial encounter: Secondary | ICD-10-CM | POA: Diagnosis not present

## 2023-08-20 DIAGNOSIS — M4125 Other idiopathic scoliosis, thoracolumbar region: Secondary | ICD-10-CM | POA: Diagnosis not present

## 2023-08-20 DIAGNOSIS — S134XXA Sprain of ligaments of cervical spine, initial encounter: Secondary | ICD-10-CM | POA: Diagnosis not present

## 2023-08-20 DIAGNOSIS — S338XXA Sprain of other parts of lumbar spine and pelvis, initial encounter: Secondary | ICD-10-CM | POA: Diagnosis not present

## 2023-09-16 ENCOUNTER — Other Ambulatory Visit (HOSPITAL_COMMUNITY): Payer: Self-pay | Admitting: Radiology

## 2023-09-16 DIAGNOSIS — R0600 Dyspnea, unspecified: Secondary | ICD-10-CM

## 2023-09-17 DIAGNOSIS — S338XXA Sprain of other parts of lumbar spine and pelvis, initial encounter: Secondary | ICD-10-CM | POA: Diagnosis not present

## 2023-09-17 DIAGNOSIS — S134XXA Sprain of ligaments of cervical spine, initial encounter: Secondary | ICD-10-CM | POA: Diagnosis not present

## 2023-09-17 DIAGNOSIS — M4125 Other idiopathic scoliosis, thoracolumbar region: Secondary | ICD-10-CM | POA: Diagnosis not present

## 2023-10-22 DIAGNOSIS — S338XXA Sprain of other parts of lumbar spine and pelvis, initial encounter: Secondary | ICD-10-CM | POA: Diagnosis not present

## 2023-10-22 DIAGNOSIS — S134XXA Sprain of ligaments of cervical spine, initial encounter: Secondary | ICD-10-CM | POA: Diagnosis not present

## 2023-10-22 DIAGNOSIS — M4125 Other idiopathic scoliosis, thoracolumbar region: Secondary | ICD-10-CM | POA: Diagnosis not present

## 2023-11-04 ENCOUNTER — Ambulatory Visit (HOSPITAL_COMMUNITY): Payer: Commercial Managed Care - PPO

## 2023-11-19 DIAGNOSIS — M4125 Other idiopathic scoliosis, thoracolumbar region: Secondary | ICD-10-CM | POA: Diagnosis not present

## 2023-11-19 DIAGNOSIS — S134XXA Sprain of ligaments of cervical spine, initial encounter: Secondary | ICD-10-CM | POA: Diagnosis not present

## 2023-11-19 DIAGNOSIS — S338XXA Sprain of other parts of lumbar spine and pelvis, initial encounter: Secondary | ICD-10-CM | POA: Diagnosis not present

## 2023-12-17 DIAGNOSIS — H60543 Acute eczematoid otitis externa, bilateral: Secondary | ICD-10-CM | POA: Diagnosis not present

## 2023-12-17 DIAGNOSIS — H6123 Impacted cerumen, bilateral: Secondary | ICD-10-CM | POA: Diagnosis not present

## 2024-03-17 DIAGNOSIS — H6123 Impacted cerumen, bilateral: Secondary | ICD-10-CM | POA: Diagnosis not present

## 2024-04-03 DIAGNOSIS — S338XXA Sprain of other parts of lumbar spine and pelvis, initial encounter: Secondary | ICD-10-CM | POA: Diagnosis not present

## 2024-04-03 DIAGNOSIS — M4125 Other idiopathic scoliosis, thoracolumbar region: Secondary | ICD-10-CM | POA: Diagnosis not present

## 2024-04-03 DIAGNOSIS — S134XXA Sprain of ligaments of cervical spine, initial encounter: Secondary | ICD-10-CM | POA: Diagnosis not present

## 2024-05-01 DIAGNOSIS — M4125 Other idiopathic scoliosis, thoracolumbar region: Secondary | ICD-10-CM | POA: Diagnosis not present

## 2024-05-01 DIAGNOSIS — S134XXA Sprain of ligaments of cervical spine, initial encounter: Secondary | ICD-10-CM | POA: Diagnosis not present

## 2024-05-01 DIAGNOSIS — S338XXA Sprain of other parts of lumbar spine and pelvis, initial encounter: Secondary | ICD-10-CM | POA: Diagnosis not present

## 2024-05-17 DIAGNOSIS — B9689 Other specified bacterial agents as the cause of diseases classified elsewhere: Secondary | ICD-10-CM | POA: Diagnosis not present

## 2024-05-17 DIAGNOSIS — Z1283 Encounter for screening for malignant neoplasm of skin: Secondary | ICD-10-CM | POA: Diagnosis not present

## 2024-05-17 DIAGNOSIS — D225 Melanocytic nevi of trunk: Secondary | ICD-10-CM | POA: Diagnosis not present

## 2024-05-17 DIAGNOSIS — B355 Tinea imbricata: Secondary | ICD-10-CM | POA: Diagnosis not present

## 2024-05-17 DIAGNOSIS — L02421 Furuncle of right axilla: Secondary | ICD-10-CM | POA: Diagnosis not present

## 2024-06-14 DIAGNOSIS — D485 Neoplasm of uncertain behavior of skin: Secondary | ICD-10-CM | POA: Diagnosis not present

## 2024-06-14 DIAGNOSIS — D225 Melanocytic nevi of trunk: Secondary | ICD-10-CM | POA: Diagnosis not present

## 2024-06-14 DIAGNOSIS — D2271 Melanocytic nevi of right lower limb, including hip: Secondary | ICD-10-CM | POA: Diagnosis not present

## 2024-06-14 DIAGNOSIS — B350 Tinea barbae and tinea capitis: Secondary | ICD-10-CM | POA: Diagnosis not present

## 2024-06-16 DIAGNOSIS — H60543 Acute eczematoid otitis externa, bilateral: Secondary | ICD-10-CM | POA: Diagnosis not present

## 2024-06-16 DIAGNOSIS — H6123 Impacted cerumen, bilateral: Secondary | ICD-10-CM | POA: Diagnosis not present

## 2024-06-27 DIAGNOSIS — S134XXA Sprain of ligaments of cervical spine, initial encounter: Secondary | ICD-10-CM | POA: Diagnosis not present

## 2024-06-27 DIAGNOSIS — S338XXA Sprain of other parts of lumbar spine and pelvis, initial encounter: Secondary | ICD-10-CM | POA: Diagnosis not present

## 2024-06-27 DIAGNOSIS — M4125 Other idiopathic scoliosis, thoracolumbar region: Secondary | ICD-10-CM | POA: Diagnosis not present

## 2024-07-05 DIAGNOSIS — D485 Neoplasm of uncertain behavior of skin: Secondary | ICD-10-CM | POA: Diagnosis not present

## 2024-07-05 DIAGNOSIS — L988 Other specified disorders of the skin and subcutaneous tissue: Secondary | ICD-10-CM | POA: Diagnosis not present

## 2024-08-04 DIAGNOSIS — H5213 Myopia, bilateral: Secondary | ICD-10-CM | POA: Diagnosis not present

## 2024-10-09 DIAGNOSIS — H6123 Impacted cerumen, bilateral: Secondary | ICD-10-CM | POA: Diagnosis not present

## 2024-10-09 DIAGNOSIS — H60543 Acute eczematoid otitis externa, bilateral: Secondary | ICD-10-CM | POA: Diagnosis not present

## 2024-11-14 ENCOUNTER — Other Ambulatory Visit (HOSPITAL_BASED_OUTPATIENT_CLINIC_OR_DEPARTMENT_OTHER): Payer: Self-pay

## 2024-11-14 MED ORDER — FLUOROMETHOLONE 0.1 % OP SUSP
1.0000 [drp] | Freq: Three times a day (TID) | OPHTHALMIC | 0 refills | Status: AC
  Filled 2024-11-14: qty 5, 34d supply, fill #0

## 2024-11-15 ENCOUNTER — Other Ambulatory Visit (HOSPITAL_BASED_OUTPATIENT_CLINIC_OR_DEPARTMENT_OTHER): Payer: Self-pay
